# Patient Record
Sex: Male | Born: 1985 | Race: White | Hispanic: Yes | Marital: Married | Smoking: Current every day smoker
Health system: Southern US, Community
[De-identification: ages and names within clinical notes are randomized; demographics above are authoritative.]

## PROBLEM LIST (undated history)

## (undated) ENCOUNTER — Emergency Department (HOSPITAL_COMMUNITY): Admission: EM | Payer: No Typology Code available for payment source | Source: Home / Self Care

## (undated) HISTORY — PX: APPENDECTOMY: SHX54

---
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
## 2004-02-03 ENCOUNTER — Emergency Department (HOSPITAL_COMMUNITY): Admission: EM | Admit: 2004-02-03 | Discharge: 2004-02-03 | Payer: Self-pay | Admitting: Emergency Medicine

## 2006-01-17 ENCOUNTER — Emergency Department (HOSPITAL_COMMUNITY): Admission: EM | Admit: 2006-01-17 | Discharge: 2006-01-17 | Payer: Self-pay | Admitting: Family Medicine

## 2006-04-30 ENCOUNTER — Emergency Department (HOSPITAL_COMMUNITY): Admission: EM | Admit: 2006-04-30 | Discharge: 2006-04-30 | Payer: Self-pay | Admitting: Emergency Medicine

## 2007-09-14 ENCOUNTER — Emergency Department (HOSPITAL_COMMUNITY): Admission: EM | Admit: 2007-09-14 | Discharge: 2007-09-14 | Payer: Self-pay | Admitting: Emergency Medicine

## 2008-06-01 ENCOUNTER — Emergency Department (HOSPITAL_COMMUNITY): Admission: EM | Admit: 2008-06-01 | Discharge: 2008-06-01 | Payer: Self-pay | Admitting: Emergency Medicine

## 2008-06-24 ENCOUNTER — Emergency Department (HOSPITAL_COMMUNITY): Admission: EM | Admit: 2008-06-24 | Discharge: 2008-06-24 | Payer: Self-pay | Admitting: Emergency Medicine

## 2008-12-01 ENCOUNTER — Emergency Department (HOSPITAL_COMMUNITY): Admission: EM | Admit: 2008-12-01 | Discharge: 2008-12-01 | Payer: Self-pay | Admitting: Family Medicine

## 2009-02-04 ENCOUNTER — Emergency Department (HOSPITAL_COMMUNITY): Admission: EM | Admit: 2009-02-04 | Discharge: 2009-02-04 | Payer: Self-pay | Admitting: Family Medicine

## 2010-05-10 ENCOUNTER — Inpatient Hospital Stay (INDEPENDENT_AMBULATORY_CARE_PROVIDER_SITE_OTHER)
Admission: RE | Admit: 2010-05-10 | Discharge: 2010-05-10 | Disposition: A | Discharge status: Home / Self Care | Payer: Self-pay | Source: Ambulatory Visit | Attending: Family Medicine | Admitting: Family Medicine

## 2010-05-10 DIAGNOSIS — M7020 Olecranon bursitis, unspecified elbow: Secondary | ICD-10-CM

## 2010-05-23 LAB — POCT URINALYSIS DIP (DEVICE)
Bilirubin Urine: NEGATIVE
Glucose, UA: NEGATIVE mg/dL
Ketones, ur: NEGATIVE mg/dL
Nitrite: NEGATIVE
Urobilinogen, UA: 0.2 mg/dL (ref 0.0–1.0)

## 2010-05-31 LAB — POCT URINALYSIS DIP (DEVICE)
Hgb urine dipstick: NEGATIVE
Urobilinogen, UA: 0.2 mg/dL (ref 0.0–1.0)
pH: 6.5 (ref 5.0–8.0)

## 2010-09-29 ENCOUNTER — Inpatient Hospital Stay (INDEPENDENT_AMBULATORY_CARE_PROVIDER_SITE_OTHER)
Admission: RE | Admit: 2010-09-29 | Discharge: 2010-09-29 | Disposition: A | Discharge status: Home / Self Care | Payer: Self-pay | Source: Ambulatory Visit | Attending: Emergency Medicine | Admitting: Emergency Medicine

## 2010-09-29 DIAGNOSIS — S81009A Unspecified open wound, unspecified knee, initial encounter: Secondary | ICD-10-CM

## 2010-10-03 ENCOUNTER — Emergency Department (HOSPITAL_COMMUNITY): Payer: Self-pay

## 2010-10-03 ENCOUNTER — Emergency Department (HOSPITAL_COMMUNITY)
Admission: EM | Admit: 2010-10-03 | Discharge: 2010-10-03 | Disposition: A | Discharge status: Home / Self Care | Payer: Self-pay | Attending: Emergency Medicine | Admitting: Emergency Medicine

## 2010-10-03 DIAGNOSIS — M79609 Pain in unspecified limb: Secondary | ICD-10-CM | POA: Insufficient documentation

## 2010-10-03 DIAGNOSIS — R110 Nausea: Secondary | ICD-10-CM | POA: Insufficient documentation

## 2010-10-03 DIAGNOSIS — R079 Chest pain, unspecified: Secondary | ICD-10-CM | POA: Insufficient documentation

## 2011-08-15 ENCOUNTER — Emergency Department (INDEPENDENT_AMBULATORY_CARE_PROVIDER_SITE_OTHER): Payer: Self-pay

## 2011-08-15 ENCOUNTER — Emergency Department (INDEPENDENT_AMBULATORY_CARE_PROVIDER_SITE_OTHER)
Admission: EM | Admit: 2011-08-15 | Discharge: 2011-08-15 | Disposition: A | Discharge status: Home / Self Care | Payer: Self-pay | Source: Home / Self Care | Attending: Family Medicine | Admitting: Family Medicine

## 2011-08-15 ENCOUNTER — Encounter (HOSPITAL_COMMUNITY): Payer: Self-pay | Admitting: *Deleted

## 2011-08-15 DIAGNOSIS — S91331A Puncture wound without foreign body, right foot, initial encounter: Secondary | ICD-10-CM

## 2011-08-15 DIAGNOSIS — S91309A Unspecified open wound, unspecified foot, initial encounter: Secondary | ICD-10-CM

## 2011-08-15 LAB — POCT RAPID STREP A: Streptococcus, Group A Screen (Direct): NEGATIVE

## 2011-08-15 MED ORDER — TETANUS-DIPHTH-ACELL PERTUSSIS 5-2.5-18.5 LF-MCG/0.5 IM SUSP
0.5000 mL | Freq: Once | INTRAMUSCULAR | Status: AC
  Administered 2011-08-15: 0.5 mL via INTRAMUSCULAR

## 2011-08-15 MED ORDER — CIPROFLOXACIN HCL 500 MG PO TABS: 500.0000 mg | ORAL_TABLET | Freq: Two times a day (BID) | ORAL | Status: AC

## 2011-08-15 MED ORDER — TETANUS-DIPHTH-ACELL PERTUSSIS 5-2.5-18.5 LF-MCG/0.5 IM SUSP
INTRAMUSCULAR | Status: AC
  Filled 2011-08-15: qty 0.5

## 2011-08-15 NOTE — ED Notes (Signed)
Pt  Also  Reports  He  Has  A  sorethroat      And  That a  Family  Member     Has   The  Symptoms  As  Well
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
## 2011-08-15 NOTE — ED Notes (Signed)
Pt  Reports      He  inj  His   r     Foot      Yesterday         He  Stuck     A  Nail         In the  Foot       He  States  He  Pulled   It  Out  -  He  Now  Has  Pain  And  Swelling

## 2011-08-15 NOTE — Discharge Instructions (Signed)
Soak foot in warm salt water twice a day, take antibiotic, return on thurs for recheck.

## 2013-01-01 ENCOUNTER — Encounter (HOSPITAL_COMMUNITY): Payer: Self-pay | Admitting: Emergency Medicine

## 2013-01-01 ENCOUNTER — Emergency Department (HOSPITAL_COMMUNITY)
Admission: EM | Admit: 2013-01-01 | Discharge: 2013-01-01 | Disposition: A | Discharge status: Home / Self Care | Payer: Self-pay | Attending: Emergency Medicine | Admitting: Emergency Medicine

## 2013-01-01 ENCOUNTER — Emergency Department (HOSPITAL_COMMUNITY): Payer: Self-pay

## 2013-01-01 DIAGNOSIS — F17200 Nicotine dependence, unspecified, uncomplicated: Secondary | ICD-10-CM | POA: Insufficient documentation

## 2013-01-01 DIAGNOSIS — S60552A Superficial foreign body of left hand, initial encounter: Secondary | ICD-10-CM

## 2013-01-01 DIAGNOSIS — S60559A Superficial foreign body of unspecified hand, initial encounter: Secondary | ICD-10-CM | POA: Insufficient documentation

## 2013-01-01 DIAGNOSIS — X7401XA Intentional self-harm by airgun, initial encounter: Secondary | ICD-10-CM | POA: Insufficient documentation

## 2013-01-01 MED ORDER — IBUPROFEN 800 MG PO TABS: 800.0000 mg | ORAL_TABLET | Freq: Three times a day (TID) | ORAL | Status: DC

## 2013-01-01 MED ORDER — ONDANSETRON HCL 4 MG/2ML IJ SOLN
4.0000 mg | Freq: Once | INTRAMUSCULAR | Status: AC
  Administered 2013-01-01: 4 mg via INTRAVENOUS
  Filled 2013-01-01: qty 2

## 2013-01-01 MED ORDER — FENTANYL CITRATE 0.05 MG/ML IJ SOLN
50.0000 ug | Freq: Once | INTRAMUSCULAR | Status: AC
  Administered 2013-01-01: 50 ug via INTRAVENOUS
  Filled 2013-01-01: qty 2

## 2013-01-01 MED ORDER — CEPHALEXIN 500 MG PO CAPS: 500.0000 mg | ORAL_CAPSULE | Freq: Four times a day (QID) | ORAL | Status: DC

## 2013-01-01 MED ORDER — CEFAZOLIN SODIUM 1-5 GM-% IV SOLN
1.0000 g | Freq: Once | INTRAVENOUS | Status: AC
  Administered 2013-01-01: 1 g via INTRAVENOUS
  Filled 2013-01-01: qty 50

## 2013-01-01 MED ORDER — TETANUS-DIPHTH-ACELL PERTUSSIS 5-2.5-18.5 LF-MCG/0.5 IM SUSP: 0.5000 mL | Freq: Once | INTRAMUSCULAR | Status: DC
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
## 2013-01-01 NOTE — ED Notes (Signed)
MD at bedside. W/ suture cart

## 2013-01-01 NOTE — ED Notes (Signed)
Pt verbalized understanding of discharge instructions and stated family would be driving him home.
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
## 2013-01-01 NOTE — ED Notes (Signed)
EDP at bedside with ultrasound 

## 2013-04-14 ENCOUNTER — Encounter (HOSPITAL_COMMUNITY): Payer: Self-pay | Admitting: Emergency Medicine

## 2013-04-14 ENCOUNTER — Emergency Department (HOSPITAL_COMMUNITY)
Admission: EM | Admit: 2013-04-14 | Discharge: 2013-04-14 | Disposition: A | Discharge status: Home / Self Care | Payer: No Typology Code available for payment source | Source: Home / Self Care | Attending: Emergency Medicine | Admitting: Emergency Medicine

## 2013-04-14 ENCOUNTER — Other Ambulatory Visit (HOSPITAL_COMMUNITY)
Admission: RE | Admit: 2013-04-14 | Discharge: 2013-04-14 | Disposition: A | Discharge status: Home / Self Care | Payer: No Typology Code available for payment source | Source: Ambulatory Visit | Attending: Emergency Medicine | Admitting: Emergency Medicine

## 2013-04-14 DIAGNOSIS — R300 Dysuria: Secondary | ICD-10-CM

## 2013-04-14 DIAGNOSIS — Z113 Encounter for screening for infections with a predominantly sexual mode of transmission: Secondary | ICD-10-CM | POA: Insufficient documentation

## 2013-04-14 LAB — POCT URINALYSIS DIP (DEVICE)
Bilirubin Urine: NEGATIVE
GLUCOSE, UA: NEGATIVE mg/dL
Hgb urine dipstick: NEGATIVE
KETONES UR: NEGATIVE mg/dL
Leukocytes, UA: NEGATIVE
Nitrite: NEGATIVE
Protein, ur: NEGATIVE mg/dL
SPECIFIC GRAVITY, URINE: 1.02 (ref 1.005–1.030)
Urobilinogen, UA: 0.2 mg/dL (ref 0.0–1.0)
pH: 7 (ref 5.0–8.0)

## 2013-04-14 MED ORDER — CEFTRIAXONE SODIUM 250 MG IJ SOLR
INTRAMUSCULAR | Status: AC
  Filled 2013-04-14: qty 250

## 2013-04-14 MED ORDER — CEFTRIAXONE SODIUM 250 MG IJ SOLR
250.0000 mg | Freq: Once | INTRAMUSCULAR | Status: AC
  Administered 2013-04-14: 250 mg via INTRAMUSCULAR

## 2013-04-14 MED ORDER — CIPROFLOXACIN HCL 500 MG PO TABS: 500.0000 mg | ORAL_TABLET | Freq: Two times a day (BID) | ORAL | Status: DC

## 2013-04-14 MED ORDER — AZITHROMYCIN 250 MG PO TABS
1000.0000 mg | ORAL_TABLET | Freq: Once | ORAL | Status: AC
  Administered 2013-04-14: 1000 mg via ORAL

## 2013-04-14 MED ORDER — AZITHROMYCIN 250 MG PO TABS
ORAL_TABLET | ORAL | Status: AC
  Filled 2013-04-14: qty 4

## 2013-04-14 MED ORDER — LIDOCAINE HCL (PF) 1 % IJ SOLN
INTRAMUSCULAR | Status: AC
  Filled 2013-04-14: qty 5

## 2013-04-14 NOTE — ED Notes (Signed)
Patient states he has burning when he voids

## 2013-04-14 NOTE — Discharge Instructions (Signed)

## 2013-04-14 NOTE — ED Provider Notes (Signed)
Chief Complaint   Chief Complaint  Patient presents with  . painful urination     History of Present Illness   Harold Peters is a 28 year old male who has had a two-week history of dysuria, and some blood in the tip of the urethra. He denies any discharge. He's had no frequency, urgency, change in his stream, lower abdominal or lower back pain. He denies any fever, chills, nausea, or vomiting. He's not had a prior history of urinary tract infections, urinary stones, or prostatitis.  Review of Systems   Other than as noted above, the patient denies any of the following symptoms: General:  No fever or chills. GI:  No abdominal pain, back pain, nausea, vomiting, diarrhea, or constipation. GU:  No dysuria, frequency, urgency, hematuria, urethral discharge, penile lesions, penile pain, testicular pain, swelling, or mass, or inguinal lymphadenopathy.  PMFSH   Past medical history, family history, social history, meds, and allergies were reviewed.    Physical Exam    Vital signs:  BP 136/80  Pulse 80  Temp(Src) 97.8 F (36.6 C) (Oral)  Resp 14  SpO2 100% Gen:  Alert, oriented, in no distress. Lungs:  Clear to auscultation, no wheezes, rales or rhonchi. Heart:  Regular rhythm, no gallop or murmer. Abdomen:  Flat and soft.  No tenderness to palpation, guarding, or rebound.  No hepato-splenomegaly or mass.  Bowel sounds were normally active.  No hernia. Genital exam:  Was completely normal. There was no urethral discharge or bleeding. No lesions on the penis, no inguinal lymphadenopathy, no testicular tenderness, mass, or swelling. Back:  No CVA tenderness.  Skin:  Clear, warm and dry.  Labs   Results for orders placed during the hospital encounter of 04/14/13  POCT URINALYSIS DIP (DEVICE)      Result Value Ref Range   Glucose, UA NEGATIVE  NEGATIVE mg/dL   Bilirubin Urine NEGATIVE  NEGATIVE   Ketones, ur NEGATIVE  NEGATIVE mg/dL   Specific Gravity, Urine 1.020   1.005 - 1.030   Hgb urine dipstick NEGATIVE  NEGATIVE   pH 7.0  5.0 - 8.0   Protein, ur NEGATIVE  NEGATIVE mg/dL   Urobilinogen, UA 0.2  0.0 - 1.0 mg/dL   Nitrite NEGATIVE  NEGATIVE   Leukocytes, UA NEGATIVE  NEGATIVE    The urine was cultured. DNA probes were obtained for gonorrhea, Chlamydia, Trichomonas.  Course in Urgent Care Center   He was given Rocephin 250 mg IM and azithromycin 1000 mg by mouth.  Assessment   The encounter diagnosis was Dysuria.   Differential diagnosis includes urinary tract infection, prostatitis, urethritis, diarrhea, Chlamydia, or Trichomonas.  Plan   1.  Meds:  The following meds were prescribed:   Discharge Medication List as of 04/14/2013  7:35 PM    START taking these medications   Details  ciprofloxacin (CIPRO) 500 MG tablet Take 1 tablet (500 mg total) by mouth every 12 (twelve) hours., Starting 04/14/2013, Until Discontinued, Normal        2.  Patient Education/Counseling:  The patient was given appropriate handouts, self care instructions, and instructed in symptomatic relief.    3.  Follow up:  The patient was told to follow up here if no better in 3 to 4 days, or sooner if becoming worse in any way, and given some red flag symptoms such as fever, persistent vomiting, pain, or difficulty urinating which would prompt immediate return.  I would like him to followup with a urologist in 2 weeks.  Reuben Likesavid C Ensley Blas, MD 04/14/13 [phone]

## 2013-04-15 LAB — URINE CYTOLOGY ANCILLARY ONLY
CHLAMYDIA, DNA PROBE: NEGATIVE
NEISSERIA GONORRHEA: NEGATIVE
Trichomonas: NEGATIVE

## 2013-04-16 LAB — URINE CULTURE

## 2013-04-16 NOTE — ED Notes (Signed)
GC/Chlamydia/Trich neg., urine culture: 35,000 colonies Group B Strep (S. Agalactiae)-no sensitivity due to the predictability of PCN.  Pt. treated with Cipro.  Discussed with Langston MaskerKaren Sofia PA and she said this is contaminant and treatment should be adequate. Vassie MoselleYork, Serrina Minogue M 04/16/2013

## 2013-04-17 ENCOUNTER — Telehealth (HOSPITAL_COMMUNITY): Payer: Self-pay | Admitting: *Deleted

## 2013-04-17 NOTE — ED Notes (Addendum)
Dr. Lorenz CoasterKeller requested I called pt. for clinical improvement. If not better he said to call pt. Amoxicillin. I called pt. and left a message to call. Call 1. Harold Peters, Avice Funchess M 04/17/2013 Pt. called back.  Pt. verified x 2 and given results.  Pt. said it still burns when he peas.  Pt. told to stop Cipro and take Amoxicillin for 10 days.  Pt. wants Rx. called to [facility] on [address].   I called Rx. to pharmacist @ [phone].

## 2013-06-20 ENCOUNTER — Emergency Department (HOSPITAL_COMMUNITY)
Admission: EM | Admit: 2013-06-20 | Discharge: 2013-06-20 | Disposition: A | Discharge status: Home / Self Care | Payer: No Typology Code available for payment source | Attending: Emergency Medicine | Admitting: Emergency Medicine

## 2013-06-20 ENCOUNTER — Encounter (HOSPITAL_COMMUNITY): Payer: Self-pay | Admitting: Emergency Medicine

## 2013-06-20 ENCOUNTER — Emergency Department (HOSPITAL_COMMUNITY): Payer: No Typology Code available for payment source

## 2013-06-20 DIAGNOSIS — F17200 Nicotine dependence, unspecified, uncomplicated: Secondary | ICD-10-CM | POA: Insufficient documentation

## 2013-06-20 DIAGNOSIS — Z792 Long term (current) use of antibiotics: Secondary | ICD-10-CM | POA: Insufficient documentation

## 2013-06-20 DIAGNOSIS — Y9389 Activity, other specified: Secondary | ICD-10-CM | POA: Insufficient documentation

## 2013-06-20 DIAGNOSIS — Y92410 Unspecified street and highway as the place of occurrence of the external cause: Secondary | ICD-10-CM | POA: Insufficient documentation

## 2013-06-20 DIAGNOSIS — R0789 Other chest pain: Secondary | ICD-10-CM

## 2013-06-20 DIAGNOSIS — S0993XA Unspecified injury of face, initial encounter: Secondary | ICD-10-CM | POA: Insufficient documentation

## 2013-06-20 DIAGNOSIS — S199XXA Unspecified injury of neck, initial encounter: Secondary | ICD-10-CM

## 2013-06-20 DIAGNOSIS — S298XXA Other specified injuries of thorax, initial encounter: Secondary | ICD-10-CM | POA: Insufficient documentation

## 2013-06-20 MED ORDER — IBUPROFEN 800 MG PO TABS: 800.0000 mg | ORAL_TABLET | Freq: Three times a day (TID) | ORAL | Status: DC | PRN

## 2013-06-20 MED ORDER — HYDROCODONE-ACETAMINOPHEN 5-325 MG PO TABS: 1.0000 | ORAL_TABLET | Freq: Four times a day (QID) | ORAL | Status: DC | PRN

## 2013-06-20 MED ORDER — OXYCODONE-ACETAMINOPHEN 5-325 MG PO TABS
1.0000 | ORAL_TABLET | Freq: Once | ORAL | Status: AC
  Administered 2013-06-20: 1 via ORAL
  Filled 2013-06-20: qty 1

## 2013-06-20 NOTE — Discharge Instructions (Signed)
Return here as needed.  Followup with your primary care doctor use ice and heat on areas that are sore.  The x-rays didn't show any abnormality
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
## 2013-06-20 NOTE — Discharge Planning (Signed)
[facility] Community Liaison  Patient is an [facility]. Patient states he hasn't had an appointment with the clinic since obtaining the orange card. Follow up appointment was made for Monday May 4,2015 at 12:00 pm, patient is aware of this appointment. Patient also expressed concerns about wanted "help with drinking", resources were given by Tyler Aasoris B. Child psychotherapistocial Worker. Patient was educated on the importance of maintaining a relationship with his primary care and proper uses of his orange card. No other needs expressed at this time.

## 2013-06-23 ENCOUNTER — Ambulatory Visit: Payer: No Typology Code available for payment source

## 2013-06-23 NOTE — ED Provider Notes (Signed)
Medical screening examination/treatment/procedure(s) were performed by non-physician practitioner and as supervising physician I was immediately available for consultation/collaboration.   EKG Interpretation None        Dequavious Harshberger W. Tabetha Haraway, MD 06/23/13 1652 

## 2013-06-27 ENCOUNTER — Ambulatory Visit: Payer: No Typology Code available for payment source | Attending: Internal Medicine | Admitting: Internal Medicine

## 2013-06-27 ENCOUNTER — Encounter: Payer: Self-pay | Admitting: Internal Medicine

## 2013-06-27 VITALS — BP 123/67 | HR 55 | Temp 98.8°F | Resp 16 | Wt 214.0 lb

## 2013-06-27 DIAGNOSIS — R071 Chest pain on breathing: Secondary | ICD-10-CM

## 2013-06-27 DIAGNOSIS — M25519 Pain in unspecified shoulder: Secondary | ICD-10-CM | POA: Insufficient documentation

## 2013-06-27 DIAGNOSIS — Z7689 Persons encountering health services in other specified circumstances: Secondary | ICD-10-CM

## 2013-06-27 DIAGNOSIS — Y92410 Unspecified street and highway as the place of occurrence of the external cause: Secondary | ICD-10-CM | POA: Insufficient documentation

## 2013-06-27 DIAGNOSIS — F1010 Alcohol abuse, uncomplicated: Secondary | ICD-10-CM | POA: Insufficient documentation

## 2013-06-27 DIAGNOSIS — Z79899 Other long term (current) drug therapy: Secondary | ICD-10-CM | POA: Insufficient documentation

## 2013-06-27 DIAGNOSIS — R0789 Other chest pain: Secondary | ICD-10-CM

## 2013-06-27 DIAGNOSIS — F17200 Nicotine dependence, unspecified, uncomplicated: Secondary | ICD-10-CM

## 2013-06-27 DIAGNOSIS — K029 Dental caries, unspecified: Secondary | ICD-10-CM

## 2013-06-27 DIAGNOSIS — IMO0001 Reserved for inherently not codable concepts without codable children: Secondary | ICD-10-CM

## 2013-06-27 DIAGNOSIS — M791 Myalgia, unspecified site: Secondary | ICD-10-CM

## 2013-06-27 DIAGNOSIS — Z7189 Other specified counseling: Secondary | ICD-10-CM

## 2013-06-27 MED ORDER — CYCLOBENZAPRINE HCL 10 MG PO TABS: 10.0000 mg | ORAL_TABLET | Freq: Three times a day (TID) | ORAL | Status: DC | PRN

## 2013-06-27 MED ORDER — IBUPROFEN 800 MG PO TABS: 800.0000 mg | ORAL_TABLET | Freq: Three times a day (TID) | ORAL | Status: DC | PRN
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
## 2013-07-03 ENCOUNTER — Ambulatory Visit: Payer: No Typology Code available for payment source | Attending: Internal Medicine

## 2013-07-03 DIAGNOSIS — Z7689 Persons encountering health services in other specified circumstances: Secondary | ICD-10-CM

## 2013-07-03 LAB — COMPLETE METABOLIC PANEL WITH GFR
ALT: 19 U/L (ref 0–53)
AST: 20 U/L (ref 0–37)
Albumin: 4.8 g/dL (ref 3.5–5.2)
Alkaline Phosphatase: 64 U/L (ref 39–117)
BUN: 13 mg/dL (ref 6–23)
CHLORIDE: 106 meq/L (ref 96–112)
CO2: 28 meq/L (ref 19–32)
CREATININE: 0.78 mg/dL (ref 0.50–1.35)
Calcium: 9.7 mg/dL (ref 8.4–10.5)
GFR, Est Non African American: >89 mL/min
Glucose, Bld: 78 mg/dL (ref 70–99)
Potassium: 4.9 mEq/L (ref 3.5–5.3)
Sodium: 139 mEq/L (ref 135–145)
Total Bilirubin: 0.8 mg/dL (ref 0.2–1.2)
Total Protein: 7.2 g/dL (ref 6.0–8.3)

## 2013-07-03 LAB — LIPID PANEL
CHOL/HDL RATIO: 5 ratio
Cholesterol: 149 mg/dL (ref 0–200)
HDL: 30 mg/dL — ABNORMAL LOW (ref >39)
LDL CALC: 75 mg/dL (ref 0–99)
Triglycerides: 219 mg/dL — ABNORMAL HIGH (ref <150)
VLDL: 44 mg/dL — ABNORMAL HIGH (ref 0–40)

## 2013-07-03 LAB — CBC
HEMATOCRIT: 43.9 % (ref 39.0–52.0)
Hemoglobin: 15.5 g/dL (ref 13.0–17.0)
MCH: 29.6 pg (ref 26.0–34.0)
MCHC: 35.3 g/dL (ref 30.0–36.0)
MCV: 83.9 fL (ref 78.0–100.0)
Platelets: 198 10*3/uL (ref 150–400)
RBC: 5.23 MIL/uL (ref 4.22–5.81)
RDW: 13.7 % (ref 11.5–15.5)
WBC: 5.5 10*3/uL (ref 4.0–10.5)

## 2013-07-03 LAB — HEMOGLOBIN A1C
HEMOGLOBIN A1C: 5.3 % (ref <5.7)
MEAN PLASMA GLUCOSE: 105 mg/dL (ref <117)

## 2013-07-03 LAB — TSH: TSH: 4.886 u[IU]/mL — AB (ref 0.350–4.500)

## 2013-07-04 ENCOUNTER — Telehealth: Payer: Self-pay

## 2013-07-04 DIAGNOSIS — Z1329 Encounter for screening for other suspected endocrine disorder: Secondary | ICD-10-CM

## 2013-07-04 NOTE — Telephone Encounter (Signed)
Patient called and notified of lab results.  Patient scheduled to have free T4 checked on 07/07/13. Patient educated on triglyceride level and educated on exercise, diet, and avoiding excess alcohol.  Patient verbalized understanding.

## 2013-07-07 ENCOUNTER — Ambulatory Visit: Payer: No Typology Code available for payment source | Attending: Internal Medicine

## 2013-07-07 DIAGNOSIS — Z1329 Encounter for screening for other suspected endocrine disorder: Secondary | ICD-10-CM

## 2013-07-08 LAB — T4, FREE: FREE T4: 1.16 ng/dL (ref 0.80–1.80)

## 2013-07-09 ENCOUNTER — Telehealth: Payer: Self-pay

## 2013-07-09 NOTE — Telephone Encounter (Signed)
Message copied by Marykay LexBECK, Calise Dunckel K on Wed Jul 09, 2013  4:32 PM ------      Message from: Holland CommonsKECK, VALERIE A      Created: Tue Jul 08, 2013  7:41 PM       Let patient know that his last lab was normal although his TSH was elevated so we will not treat patient at this time. If patient begin to have symptoms of weight gain, fatigue, hair loss, cold intolerance, depression, etc let him know to come back to be evaluated. Will recheck thyroid function in 6 months if no problems.  Thanks ------

## 2013-07-09 NOTE — Telephone Encounter (Signed)
Patient notified of free T4 results, and informed to notify the clinic if he has weight gain, fatigue, hair loss, cold intolerance, depression.

## 2014-03-09 ENCOUNTER — Ambulatory Visit: Payer: Self-pay | Attending: Internal Medicine

## 2014-03-16 ENCOUNTER — Ambulatory Visit: Payer: Self-pay | Admitting: Internal Medicine

## 2014-03-23 ENCOUNTER — Ambulatory Visit: Payer: Self-pay | Admitting: Internal Medicine
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
## 2014-12-30 ENCOUNTER — Emergency Department (HOSPITAL_COMMUNITY)
Admission: EM | Admit: 2014-12-30 | Discharge: 2014-12-30 | Disposition: A | Discharge status: Home / Self Care | Payer: Self-pay | Attending: Emergency Medicine | Admitting: Emergency Medicine

## 2014-12-30 ENCOUNTER — Emergency Department (HOSPITAL_COMMUNITY): Payer: Self-pay

## 2014-12-30 ENCOUNTER — Encounter (HOSPITAL_COMMUNITY): Payer: Self-pay | Admitting: Family Medicine

## 2014-12-30 DIAGNOSIS — W3189XA Contact with other specified machinery, initial encounter: Secondary | ICD-10-CM | POA: Insufficient documentation

## 2014-12-30 DIAGNOSIS — Y939 Activity, unspecified: Secondary | ICD-10-CM | POA: Insufficient documentation

## 2014-12-30 DIAGNOSIS — Y999 Unspecified external cause status: Secondary | ICD-10-CM | POA: Insufficient documentation

## 2014-12-30 DIAGNOSIS — S61235A Puncture wound without foreign body of left ring finger without damage to nail, initial encounter: Secondary | ICD-10-CM | POA: Insufficient documentation

## 2014-12-30 DIAGNOSIS — Y929 Unspecified place or not applicable: Secondary | ICD-10-CM | POA: Insufficient documentation

## 2014-12-30 MED ORDER — LIDOCAINE HCL 2 % IJ SOLN: 10.0000 mL | Freq: Once | INTRAMUSCULAR | Status: DC

## 2014-12-30 MED ORDER — TETANUS-DIPHTH-ACELL PERTUSSIS 5-2.5-18.5 LF-MCG/0.5 IM SUSP
0.5000 mL | Freq: Once | INTRAMUSCULAR | Status: AC
  Administered 2014-12-30: 0.5 mL via INTRAMUSCULAR
  Filled 2014-12-30: qty 0.5

## 2014-12-30 MED ORDER — HYDROCODONE-ACETAMINOPHEN 5-325 MG PO TABS: 1.0000 | ORAL_TABLET | Freq: Four times a day (QID) | ORAL | Status: DC | PRN

## 2014-12-30 MED ORDER — LIDOCAINE HCL 2 % IJ SOLN
10.0000 mL | Freq: Once | INTRAMUSCULAR | Status: AC
  Administered 2014-12-30: 10 mg via INTRADERMAL
  Filled 2014-12-30: qty 20

## 2014-12-30 MED ORDER — HYDROCODONE-ACETAMINOPHEN 5-325 MG PO TABS
1.0000 | ORAL_TABLET | Freq: Once | ORAL | Status: AC
Start: 2014-12-30 — End: 2014-12-30
  Administered 2014-12-30: 1 via ORAL
  Filled 2014-12-30: qty 1

## 2014-12-30 MED ORDER — CEPHALEXIN 500 MG PO CAPS: 1000.0000 mg | ORAL_CAPSULE | Freq: Two times a day (BID) | ORAL | Status: DC

## 2014-12-30 NOTE — ED Notes (Signed)
Pt here for injury to left ring finger. sts that he drilled through finger. Pt able to move finger and feeling in tact.

## 2014-12-30 NOTE — Discharge Instructions (Signed)
Puncture Wound A puncture wound is an injury that is caused by a sharp, thin object that goes through (penetrates) your skin. Usually, a puncture wound does not leave a large opening in your skin, so it may not bleed a lot. However, when you get a puncture wound, dirt or other materials (foreign bodies) can be forced into your wound and break off inside. This increases the chance of infection, such as tetanus. CAUSES Puncture wounds are caused by any sharp, thin object that goes through your skin, such as:  Animal teeth, as with an animal bite.  Sharp, pointed objects, such as nails, splinters of glass, fishhooks, and needles. SYMPTOMS Symptoms of a puncture wound include:  Pain.  Bleeding.  Swelling.  Bruising.  Fluid leaking from the wound.  Numbness, tingling, or loss of function. DIAGNOSIS This condition is diagnosed with a medical history and physical exam. Your wound will be checked to see if it contains any foreign bodies. You may also have X-rays or other imaging tests. TREATMENT Treatment for a puncture wound depends on how serious the wound is. It also depends on whether the wound contains any foreign bodies. Treatment for all types of puncture wounds usually starts with:  Controlling the bleeding.  Washing out the wound with a germ-free (sterile) salt-water solution.  Checking the wound for foreign bodies. Treatment may also include:  Having the wound opened surgically to remove a foreign object.  Closing the wound with stitches (sutures) if it continues to bleed.  Covering the wound with antibiotic ointments and a bandage (dressing).  Receiving a tetanus shot.  Receiving a rabies vaccine. HOME CARE INSTRUCTIONS Medicines  Take or apply over-the-counter and prescription medicines only as told by your health care provider.  If you were prescribed an antibiotic, take or apply it as told by your health care provider. Do not stop using the antibiotic even if  your condition improves. Wound Care  There are many ways to close and cover a wound. For example, a wound can be covered with sutures, skin glue, or adhesive strips. Follow instructions from your health care provider about:  How to take care of your wound.  When and how you should change your dressing.  When you should remove your dressing.  Removing whatever was used to close your wound.  Keep the dressing dry as told by your health care provider. Do not take baths, swim, use a hot tub, or do anything that would put your wound underwater until your health care provider approves.  Clean the wound as told by your health care provider.  Do not scratch or pick at the wound.  Check your wound every day for signs of infection. Watch for:  Redness, swelling, or pain.  Fluid, blood, or pus. General Instructions  Raise (elevate) the injured area above the level of your heart while you are sitting or lying down.  If your puncture wound is in your foot, ask your health care provider if you need to avoid putting weight on your foot and for how long.  Keep all follow-up visits as told by your health care provider. This is important. SEEK MEDICAL CARE IF:  You received a tetanus shot and you have swelling, severe pain, redness, or bleeding at the injection site.  You have a fever.  Your sutures come out.  You notice a bad smell coming from your wound or your dressing.  You notice something coming out of your wound, such as wood or glass.  Your   pain is not controlled with medicine.  You have increased redness, swelling, or pain at the site of your wound.  You have fluid, blood, or pus coming from your wound.  You notice a change in the color of your skin near your wound.  You need to change the dressing frequently due to fluid, blood, or pus draining from your wound.  You develop a new rash.  You develop numbness around your wound. SEEK IMMEDIATE MEDICAL CARE IF:  You  develop severe swelling around your wound.  Your pain suddenly increases and is severe.  You develop painful skin lumps.  You have a red streak going away from your wound.  The wound is on your hand or foot and you cannot properly move a finger or toe.  The wound is on your hand or foot and you notice that your fingers or toes look pale or bluish.   This information is not intended to replace advice given to you by your health care provider. Make sure you discuss any questions you have with your health care provider.   Document Released: 11/16/2004 Document Revised: 10/28/2014 Document Reviewed: 04/01/2014 Elsevier Interactive Patient Education 2016 [employer]  

## 2014-12-30 NOTE — ED Provider Notes (Signed)
CSN: [id number]     Arrival date & time 12/30/14  1210 History  By signing my name below, I, Lyndel Safe, attest that this documentation has been prepared under the direction and in the presence of Fayrene Helper, PA-C.  Electronically Signed: Lyndel Safe, ED Scribe. 12/30/2014. 1:35 PM.   Chief Complaint  Patient presents with  . Finger Injury   Patient is a 29 y.o. male presenting with hand pain. The history is provided by the patient. No language interpreter was used.  Hand Pain This is a new problem. The current episode started 1 to 2 hours ago. The problem occurs constantly. The problem has not changed since onset.Nothing aggravates the symptoms. Nothing relieves the symptoms. He has tried nothing for the symptoms. The treatment provided no relief.   HPI Comments: Harold Peters is a 29 y.o. male, who is right hand dominant, presents to the Emergency Department for evaluation of a left, 4th finger injury that occurred 2 hours ago. He has a through and through puncture wound to the distal aspect of the left 4th ring finger. Bleeding is controlled.Pt reports he was working with a drill when the drill slipped and punctured through his left, 4th digit.  He associates intermittent, sharp pain with the puncture wound. Tetanus not UTD. No fevers.   History reviewed. No pertinent past medical history. Past Surgical History  Procedure Laterality Date  . Appendectomy     Family History  Problem Relation Age of Onset  . Diabetes Mother   . Diabetes Maternal Uncle    Social History  Substance Use Topics  . Smoking status: Current Every Day Smoker  . Smokeless tobacco: None  . Alcohol Use: 1.8 oz/week    3 Cans of beer per week    Review of Systems  Constitutional: Negative for fever.  Skin: Positive for wound ( puncture wound to left 4th finger ).  Hematological: Does not bruise/bleed easily.   Allergies  Review of patient's allergies indicates no known  allergies.  Home Medications   Prior to Admission medications   Medication Sig Start Date End Date Taking? Authorizing Provider  acetaminophen (TYLENOL) 500 MG tablet Take 1,000 mg by mouth every 6 (six) hours as needed for moderate pain, fever or headache.    Historical Provider, MD  cyclobenzaprine (FLEXERIL) 10 MG tablet Take 1 tablet (10 mg total) by mouth 3 (three) times daily as needed for muscle spasms. 06/27/13   Ambrose Finland, NP  HYDROcodone-acetaminophen (NORCO/VICODIN) 5-325 MG per tablet Take 1 tablet by mouth every 6 (six) hours as needed for moderate pain. 06/20/13   Charlestine Night, PA-C  ibuprofen (ADVIL,MOTRIN) 800 MG tablet Take 1 tablet (800 mg total) by mouth every 8 (eight) hours as needed. 06/27/13   Ambrose Finland, NP   There were no vitals taken for this visit. Physical Exam  Constitutional: He is oriented to person, place, and time. He appears well-developed and well-nourished. No distress.  HENT:  Head: Normocephalic.  Eyes: Conjunctivae are normal.  Neck: Normal range of motion. Neck supple.  Cardiovascular: Normal rate.   Pulmonary/Chest: Effort normal. No respiratory distress.  Musculoskeletal: Normal range of motion.  Left hand, ring finger; puncture wound noted from the pad of finger penetrating through and through to the ulnar aspect of finger, no FB noted, no ulnar nerve involvement, sensation intact distally, not actively bleeding.   Neurological: He is alert and oriented to person, place, and time. Coordination normal.  Skin: Skin is warm.  Psychiatric:  He has a normal mood and affect. His behavior is normal.  Nursing note and vitals reviewed.   ED Course  Procedures   COORDINATION OF CARE: 1:24 PM Discussed treatment plan with pt at bedside and pt agreed to plan. Tdap updated. Pain medication ordered.   LACERATION REPAIR PROCEDURE NOTE The patient's identification was confirmed and consent was obtained. This procedure was performed by Fayrene HelperBowie Makinsey Pepitone,  PA-C at 1:49 PM. Site: left ring finger Sterile procedures observed Anesthetic used (type and amt): digital block performed with 3ml of lidocaine 2% w/o epinephrine  Suture type/size: no sutures used  Antibx ointment applied Tetanus ordered Site anesthetized, irrigated with NS, explored without evidence of foreign body, site covered with dry, sterile dressing.  Patient tolerated procedure well without complications. Instructions for care discussed verbally and patient provided with additional written instructions for homecare and f/u.   Imaging Review Dg Finger Ring Left  12/30/2014  CLINICAL DATA:  Recent injury to the fourth digit with a drill, initial encounter EXAM: LEFT RING FINGER 2+V COMPARISON:  01/01/2013 FINDINGS: Soft tissue defect is noted along the distal phalanx of the fourth digit. No underlying bony abnormality is noted. No radiopaque foreign body is seen. IMPRESSION: Soft tissue injury without acute bony abnormality. Electronically Signed   By: Alcide CleverMark  Lukens M.D.   On: 12/30/2014 13:14   I have personally reviewed and evaluated these images as part of my medical decision-making.   MDM   Final diagnoses:  Puncture wound of left ring finger w/o foreign body w/o damage to nail, initial encounter    BP 116/70 mmHg  Pulse 55  Resp 16  SpO2 100%  I personally performed the services described in this documentation, which was scribed in my presence. The recorded information has been reviewed and is accurate.        Fayrene HelperBowie Nobie Alleyne, PA-C 12/30/14 1424  Mancel BaleElliott Wentz, MD 12/31/14 1911

## 2014-12-30 NOTE — ED Notes (Signed)
Pt was drilling fiber-glass, slipped and drilled into left ring finger. Minimally oozing blood.

## 2016-04-06 ENCOUNTER — Encounter (HOSPITAL_COMMUNITY): Payer: Self-pay | Admitting: Emergency Medicine

## 2016-04-06 ENCOUNTER — Emergency Department (HOSPITAL_COMMUNITY): Payer: Self-pay

## 2016-04-06 ENCOUNTER — Emergency Department (HOSPITAL_COMMUNITY)
Admission: EM | Admit: 2016-04-06 | Discharge: 2016-04-06 | Disposition: A | Discharge status: Home / Self Care | Payer: Self-pay | Attending: Emergency Medicine | Admitting: Emergency Medicine

## 2016-04-06 DIAGNOSIS — F17210 Nicotine dependence, cigarettes, uncomplicated: Secondary | ICD-10-CM | POA: Insufficient documentation

## 2016-04-06 DIAGNOSIS — J111 Influenza due to unidentified influenza virus with other respiratory manifestations: Secondary | ICD-10-CM | POA: Insufficient documentation

## 2016-04-06 DIAGNOSIS — Z79899 Other long term (current) drug therapy: Secondary | ICD-10-CM | POA: Insufficient documentation

## 2016-04-06 LAB — INFLUENZA PANEL BY PCR (TYPE A & B)
Influenza A By PCR: NEGATIVE
Influenza B By PCR: POSITIVE — AB

## 2016-04-06 LAB — RAPID STREP SCREEN (MED CTR MEBANE ONLY): Streptococcus, Group A Screen (Direct): NEGATIVE

## 2016-04-06 MED ORDER — ACETAMINOPHEN 325 MG PO TABS
ORAL_TABLET | ORAL | Status: AC
  Filled 2016-04-06: qty 2

## 2016-04-06 MED ORDER — ACETAMINOPHEN 325 MG PO TABS
650.0000 mg | ORAL_TABLET | Freq: Once | ORAL | Status: AC | PRN
  Administered 2016-04-06: 650 mg via ORAL

## 2016-04-06 MED ORDER — SODIUM CHLORIDE 0.9 % IV BOLUS (SEPSIS)
1000.0000 mL | Freq: Once | INTRAVENOUS | Status: AC
  Administered 2016-04-06: 1000 mL via INTRAVENOUS

## 2016-04-06 MED ORDER — GUAIFENESIN-CODEINE 100-10 MG/5ML PO SOLN: 5.0000 mL | Freq: Three times a day (TID) | ORAL | 0 refills | Status: DC | PRN

## 2016-04-06 MED ORDER — IBUPROFEN 400 MG PO TABS
600.0000 mg | ORAL_TABLET | Freq: Once | ORAL | Status: AC
  Administered 2016-04-06: 600 mg via ORAL
  Filled 2016-04-06: qty 1

## 2016-04-06 NOTE — ED Triage Notes (Signed)
Pt from home with c/o fever, cough, sore throat, body aches, chills, headache, and dizziness x 2 days.  Pt having difficulty standing due to dizziness in triage.  A&O.

## 2016-04-06 NOTE — ED Provider Notes (Signed)
[facility] Provider Note   CSN: [id number] Arrival date & time: 04/06/16  4098     History   Chief Complaint Chief Complaint  Patient presents with  . Flu Like Symptoms    HPI Doctor Sheahan is a 31 y.o. male.  31 year old Hispanic male with no significant past medical history presents to the ED today with flulike symptoms. Specifically patient complains of fever, cough, sore throat, body aches, chills, headache, dizziness 2 days. Patient states that his cousin was diagnosed with the flu last week. Patient has been taking Tylenol at home for his fever with little relief. He has had poor by mouth intake for the past 2 days. Patient states that when he stands he gets dizzy and lightheaded. Patient states thatit hurts to swallow on both sides of his throat. he endorses a nonproductive cough. He also endorses clear rhinorrhea and nasal congestion. Denies getting a flu vaccination this year. Nothing makes his symptoms better or worse. He denies any vision changes, chest pain, shortness of breath, abdominal pain, nausea, emesis, urinary symptoms, change in bowel habits, paresthesias.      History reviewed. No pertinent past medical history.  There are no active problems to display for this patient.   Past Surgical History:  Procedure Laterality Date  . APPENDECTOMY         Home Medications    Prior to Admission medications   Medication Sig Start Date End Date Taking? Authorizing Provider  acetaminophen (TYLENOL) 500 MG tablet Take 1,000 mg by mouth every 6 (six) hours as needed for moderate pain, fever or headache.    Historical Provider, MD  cephALEXin (KEFLEX) 500 MG capsule Take 2 capsules (1,000 mg total) by mouth 2 (two) times daily. 12/30/14   Fayrene Helper, PA-C  cyclobenzaprine (FLEXERIL) 10 MG tablet Take 1 tablet (10 mg total) by mouth 3 (three) times daily as needed for muscle spasms. 06/27/13   Ambrose Finland, NP  HYDROcodone-acetaminophen  (NORCO/VICODIN) 5-325 MG tablet Take 1 tablet by mouth every 6 (six) hours as needed for moderate pain. 12/30/14   Fayrene Helper, PA-C  ibuprofen (ADVIL,MOTRIN) 800 MG tablet Take 1 tablet (800 mg total) by mouth every 8 (eight) hours as needed. 06/27/13   Ambrose Finland, NP    Family History Family History  Problem Relation Age of Onset  . Diabetes Mother   . Diabetes Maternal Uncle     Social History Social History  Substance Use Topics  . Smoking status: Current Every Day Smoker    Packs/day: 0.20    Types: Cigarettes, E-cigarettes  . Smokeless tobacco: Never Used  . Alcohol use 1.8 oz/week    3 Cans of beer per week     Allergies   Patient has no known allergies.   Review of Systems Review of Systems  Constitutional: Positive for activity change and appetite change. Negative for chills and fever.  HENT: Positive for congestion, rhinorrhea, sneezing and sore throat.   Eyes: Negative for visual disturbance.  Respiratory: Positive for cough. Negative for shortness of breath and wheezing.   Cardiovascular: Negative for chest pain, palpitations and leg swelling.  Gastrointestinal: Negative for abdominal pain, diarrhea, nausea and vomiting.  Genitourinary: Negative for dysuria, frequency, hematuria and urgency.  Musculoskeletal: Positive for myalgias.  Skin: Negative.   Neurological: Positive for dizziness and headaches. Negative for syncope, weakness, light-headedness and numbness.  All other systems reviewed and are negative.    Physical Exam Updated Vital Signs BP 135/100 (BP Location: Right  Arm)   Pulse 99   Temp (!) 103.1 F (39.5 C) (Oral)   Resp 21   Ht 5\' 11"  (1.803 m)   Wt 101.6 kg   SpO2 100%   BMI 31.24 kg/m   Physical Exam  Constitutional: He is oriented to person, place, and time. He appears well-developed and well-nourished. No distress.  HENT:  Head: Normocephalic and atraumatic.  Right Ear: Tympanic membrane, external ear and ear canal normal.    Left Ear: Tympanic membrane, external ear and ear canal normal.  Nose: Mucosal edema and rhinorrhea present.  Mouth/Throat: Uvula is midline. Mucous membranes are dry. Posterior oropharyngeal edema and posterior oropharyngeal erythema present. No oropharyngeal exudate or tonsillar abscesses. Tonsils are 2+ on the right. Tonsils are 2+ on the left. No tonsillar exudate.  No trismus. No muffled voice. Uvula is midline.  Eyes: Conjunctivae are normal. Right eye exhibits no discharge. Left eye exhibits no discharge. No scleral icterus.  Neck: Normal range of motion. Neck supple. No thyromegaly present.  Cardiovascular: Regular rhythm, normal heart sounds and intact distal pulses.  Tachycardia present.  Exam reveals no gallop and no friction rub.   No murmur heard. Pulmonary/Chest: Effort normal and breath sounds normal. No respiratory distress. He has no wheezes. He exhibits no tenderness.  CTAB. Patient not hypoxic or tachypnea.  Abdominal: Soft. Bowel sounds are normal. He exhibits no distension. There is no tenderness. There is no rebound and no guarding.  Musculoskeletal: Normal range of motion.  Lymphadenopathy:    He has no cervical adenopathy.  Neurological: He is alert and oriented to person, place, and time.  Skin: Skin is warm and dry. Capillary refill takes less than 2 seconds.  Nursing note and vitals reviewed.    ED Treatments / Results  Labs (all labs ordered are listed, but only abnormal results are displayed) Labs Reviewed  INFLUENZA PANEL BY PCR (TYPE A & B) - Abnormal; Notable for the following:       Result Value   Influenza B By PCR POSITIVE (*)    All other components within normal limits  RAPID STREP SCREEN (NOT AT [facility])  CULTURE, GROUP A STREP [facility]    EKG  EKG Interpretation None       Radiology Dg Chest 2 View  Result Date: 04/06/2016 CLINICAL DATA:  Fever, nausea and productive cough. EXAM: CHEST  2 VIEW COMPARISON:  06/20/2013 FINDINGS: The heart  size and mediastinal contours are within normal limits. Both lungs are clear. The visualized skeletal structures are unremarkable. IMPRESSION: No active cardiopulmonary disease. Electronically Signed   By: Richarda OverlieAdam  Henn M.D.   On: 04/06/2016 10:56    Procedures Procedures (including critical care time)  Medications Ordered in ED Medications  acetaminophen (TYLENOL) tablet 650 mg (650 mg Oral Given 04/06/16 1019)  sodium chloride 0.9 % bolus 1,000 mL (0 mLs Intravenous Stopped 04/06/16 1327)  ibuprofen (ADVIL,MOTRIN) tablet 600 mg (600 mg Oral Given 04/06/16 1103)     Initial Impression / Assessment and Plan / ED Course  I have reviewed the triage vital signs and the nursing notes.  Pertinent labs & imaging results that were available during my care of the patient were reviewed by me and considered in my medical decision making (see chart for details).     The patient presented to the ED with several flulike symptoms. Patient with symptoms consistent with influenza. Positive sick contacts with the flu. Vitals are stable, low-grade fever.  Patient with mild dehydration. Given IV fluids in  the ED with significant improvement in symptoms. Fever and tachycardia improved with Motrin and Tylenol and fluids. No signs of dehydration, tolerating PO's.  Strep test is negative. Uvula is midline without any trismus. No signs of peritonsillar abscess or deep neck infection. Lungs are clear. Without any focal infiltrate.  Discussed the cost versus benefit of Tamiflu treatment with the patient.  The patient understands that symptoms are greater than the recommended 24-48 hour window of treatment.  Patient will be discharged with instructions to orally hydrate, rest, and use over-the-counter medications such as anti-inflammatories ibuprofen and Aleve for muscle aches and Tylenol for fever.  Patient will also be given a cough suppressant. Pt is hemodynamically stable, in NAD, & able to ambulate in the ED. Pain has been  managed & has no complaints prior to dc. Pt is comfortable with above plan and is stable for discharge at this time. All questions were answered prior to disposition. Strict return precautions for f/u to the ED were discussed.    Final Clinical Impressions(s) / ED Diagnoses   Final diagnoses:  Influenza    New Prescriptions Discharge Medication List as of 04/06/2016  1:24 PM    START taking these medications   Details  guaiFENesin-codeine 100-10 MG/5ML syrup Take 5 mLs by mouth 3 (three) times daily as needed for cough., Starting Thu 04/06/2016, Print         Rise Mu, PA-C 04/06/16 1338    Pricilla Loveless, MD 04/06/16 [id number]

## 2016-04-06 NOTE — Discharge Instructions (Signed)
Have the flu. Make sure you were staying hydrated plenty of water. You need to alternate Motrin and Tylenol for fever and pain. Have given you some cough medicine for your cough. This medication will make you drowsy so do not drive with it. Follow up with her primary care doctor as needed. Return to the ED develop worsening symptoms.

## 2016-04-08 LAB — CULTURE, GROUP A STREP (THRC)

## 2016-06-14 IMAGING — DX DG FINGER RING 2+V*L*
3 series · 3 of 3 positions shown · non-contrast
Comparison: 01/01/2013

CLINICAL DATA: Recent injury to the fourth digit with a drill,
initial encounter

EXAM:
LEFT RING FINGER 2+V

[finger ap]
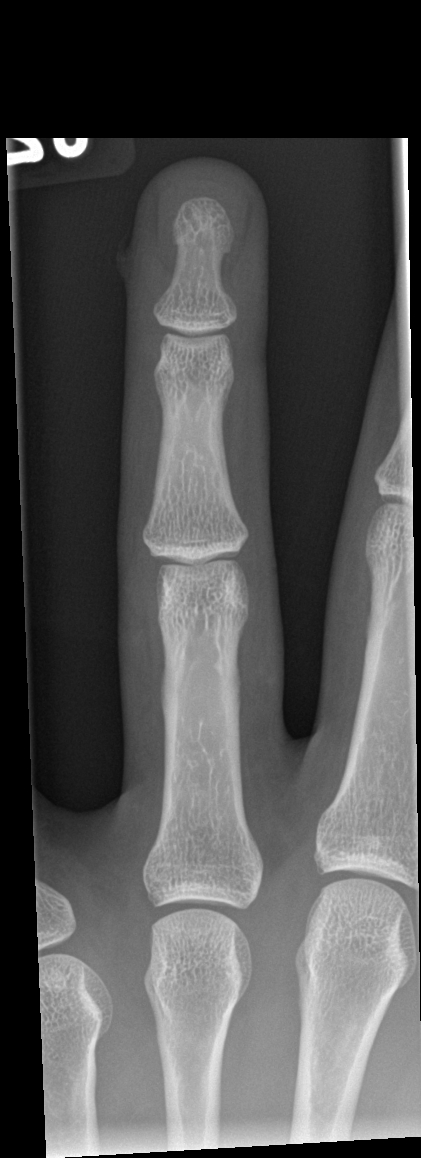

[finger obl]
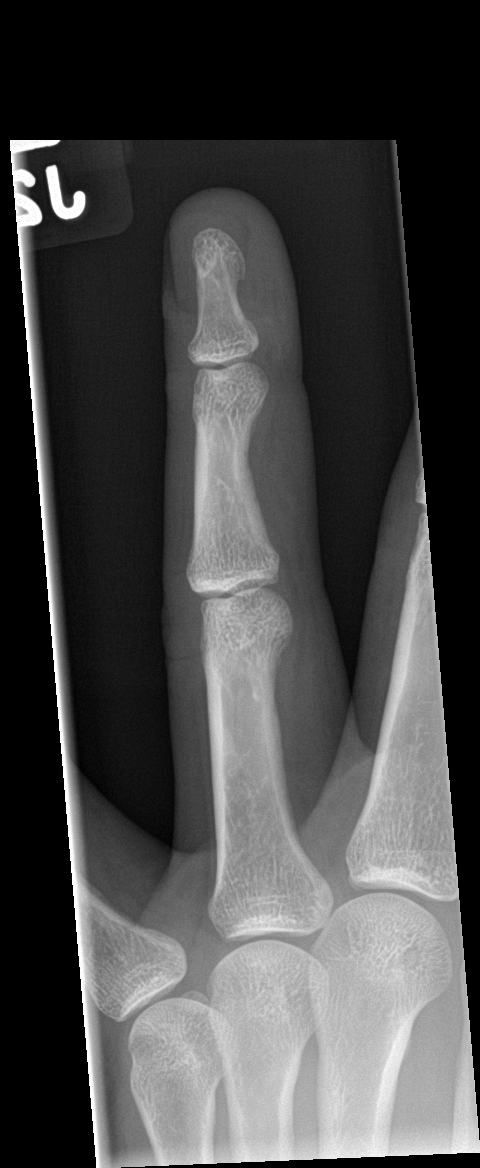

[finger lat]
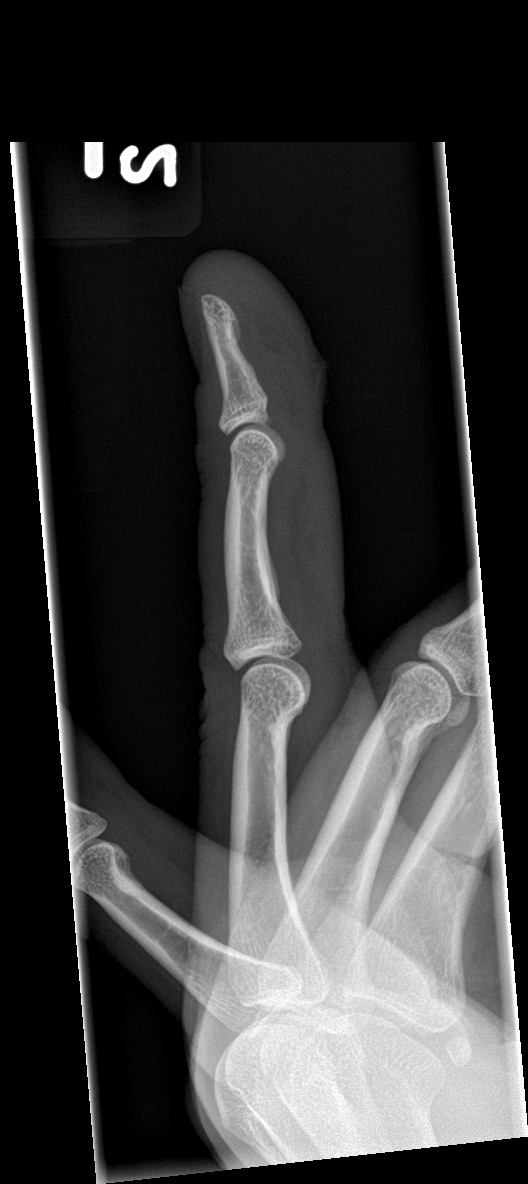

[3 of 3 positions shown; findings below may reference images not displayed]

FINDINGS: Soft tissue defect is noted along the distal phalanx of the fourth
digit. No underlying bony abnormality is noted. No radiopaque
foreign body is seen.
IMPRESSION: Soft tissue injury without acute bony abnormality.

## 2018-09-13 ENCOUNTER — Other Ambulatory Visit: Payer: Self-pay

## 2018-09-13 DIAGNOSIS — Z20822 Contact with and (suspected) exposure to covid-19: Secondary | ICD-10-CM

## 2018-09-17 LAB — NOVEL CORONAVIRUS, NAA: SARS-CoV-2, NAA: NOT DETECTED

## 2019-05-19 ENCOUNTER — Ambulatory Visit: Payer: Self-pay | Attending: Internal Medicine

## 2019-05-19 ENCOUNTER — Ambulatory Visit: Payer: Self-pay

## 2019-05-19 DIAGNOSIS — Z23 Encounter for immunization: Secondary | ICD-10-CM

## 2019-05-19 NOTE — Progress Notes (Signed)
   Covid-19 Vaccination Clinic  Name:  Craig Ionescu    MRN: [id number] DOB: October 15, 1985  05/19/2019  Mr. Wolfinger was observed post Covid-19 immunization for 15 minutes without incident. He was provided with Vaccine Information Sheet and instruction to access the V-Safe system.   Mr. Schuyler was instructed to call 911 with any severe reactions post vaccine: Marland Kitchen Difficulty breathing  . Swelling of face and throat  . A fast heartbeat  . A bad rash all over body  . Dizziness and weakness   Immunizations Administered    Name Date Dose VIS Date Route   Pfizer COVID-19 Vaccine 05/19/2019  1:35 PM 0.3 mL 01/31/2019 Intramuscular   Manufacturer: [employer], [employer]   Lot: [id number]   NDC: [address]-7

## 2019-05-22 ENCOUNTER — Ambulatory Visit: Payer: Self-pay

## 2019-06-10 ENCOUNTER — Ambulatory Visit: Payer: Self-pay | Attending: Internal Medicine

## 2019-06-10 DIAGNOSIS — Z23 Encounter for immunization: Secondary | ICD-10-CM

## 2019-06-10 NOTE — Progress Notes (Signed)
   Covid-19 Vaccination Clinic  Name:  Harold Peters    MRN: [id number] DOB: 1985/08/13  06/10/2019  [name] was observed post Covid-19 immunization for 15 minutes without incident. He was provided with Vaccine Information Sheet and instruction to access the V-Safe system.   [name] was instructed to call 911 with any severe reactions post vaccine: Marland Kitchen Difficulty breathing  . Swelling of face and throat  . A fast heartbeat  . A bad rash all over body  . Dizziness and weakness   Immunizations Administered    Name Date Dose VIS Date Route   Pfizer COVID-19 Vaccine 06/10/2019  2:18 PM 0.3 mL 04/16/2018 Intramuscular   Manufacturer: [employer], [employer]   Lot: [id number]   NDC: [address]-1

## 2019-12-01 ENCOUNTER — Other Ambulatory Visit: Payer: Self-pay

## 2020-02-27 ENCOUNTER — Other Ambulatory Visit: Payer: Self-pay

## 2022-08-13 ENCOUNTER — Ambulatory Visit (HOSPITAL_COMMUNITY)
Admission: EM | Admit: 2022-08-13 | Discharge: 2022-08-13 | Disposition: A | Discharge status: Home / Self Care | Payer: Self-pay | Attending: Emergency Medicine | Admitting: Emergency Medicine

## 2022-08-13 DIAGNOSIS — L237 Allergic contact dermatitis due to plants, except food: Secondary | ICD-10-CM

## 2022-08-13 MED ORDER — METHYLPREDNISOLONE ACETATE 80 MG/ML IJ SUSP
INTRAMUSCULAR | Status: AC
  Filled 2022-08-13: qty 1

## 2022-08-13 MED ORDER — METHYLPREDNISOLONE ACETATE 80 MG/ML IJ SUSP
60.0000 mg | Freq: Once | INTRAMUSCULAR | Status: AC
  Administered 2022-08-13: 60 mg via INTRAMUSCULAR

## 2022-08-13 MED ORDER — PREDNISONE 10 MG (21) PO TBPK: ORAL_TABLET | Freq: Every day | ORAL | 0 refills | Status: DC

## 2022-08-13 NOTE — ED Triage Notes (Signed)
Triaged by provider  

## 2022-08-13 NOTE — Discharge Instructions (Signed)
Today you are being treated for the poison ivy/oak rash  You have been given an injection of steroids today in the office today to help reduce the inflammatory process that occurs with this rash which will help minimize your itching as well as begin to clear  Starting tomorrow take prednisone every morning with food as directed, to continue the above process  You may continue use of topical calamine or Benadryl cream to help manage itching, you may also continue oral Benadryl  Please avoid long exposures to heat such as a hot steamy shower or being outside as this may cause further irritation to your rash  You may follow-up with his urgent care as needed if symptoms persist or worsen 

## 2022-08-13 NOTE — ED Provider Notes (Signed)
[facility]    CSN: [id number] Arrival date & time: 08/13/22  1233      History   Chief Complaint No chief complaint on file.   HPI Harold Peters is a 37 y.o. male.   Patient presents for evaluation of erythematous pruritic rash present to the bilateral upper extremities  for 5 days.  Has attempted use of oral and topical Benadryl as well as hydrocortisone which has helped with pruritus but rash has persisted.  Denies fever or drainage.  Was working outside at home prior to symptoms beginning, wife has similar rash.      No past medical history on file.  There are no problems to display for this patient.   Past Surgical History:  Procedure Laterality Date   APPENDECTOMY         Home Medications    Prior to Admission medications   Medication Sig Start Date End Date Taking? Authorizing Provider  acetaminophen (TYLENOL) 500 MG tablet Take 1,000 mg by mouth every 6 (six) hours as needed for moderate pain, fever or headache.    [provider]  acetaminophen (TYLENOL) 500 MG tablet Take 500 mg by mouth every 6 (six) hours as needed.    [provider]  cephALEXin (KEFLEX) 500 MG capsule Take 2 capsules (1,000 mg total) by mouth 2 (two) times daily. Patient not taking: Reported on 04/06/2016 12/30/14   Fayrene Helper, PA-C  cyclobenzaprine (FLEXERIL) 10 MG tablet Take 1 tablet (10 mg total) by mouth 3 (three) times daily as needed for muscle spasms. Patient not taking: Reported on 04/06/2016 06/27/13   Ambrose Finland, NP  guaiFENesin-codeine 100-10 MG/5ML syrup Take 5 mLs by mouth 3 (three) times daily as needed for cough. 04/06/16   Rise Mu, PA-C  HYDROcodone-acetaminophen (NORCO/VICODIN) 5-325 MG tablet Take 1 tablet by mouth every 6 (six) hours as needed for moderate pain. Patient not taking: Reported on 04/06/2016 12/30/14   Fayrene Helper, PA-C  ibuprofen (ADVIL,MOTRIN) 800 MG tablet Take 1 tablet (800 mg total) by mouth every 8  (eight) hours as needed. Patient not taking: Reported on 04/06/2016 06/27/13   Ambrose Finland, NP    Family History Family History  Problem Relation Age of Onset   Diabetes Mother    Diabetes Maternal Uncle     Social History Social History   Tobacco Use   Smoking status: Every Day    Packs/day: .2    Types: Cigarettes, E-cigarettes   Smokeless tobacco: Never  Substance Use Topics   Alcohol use: Yes    Alcohol/week: 3.0 standard drinks of alcohol    Types: 3 Cans of beer per week   Drug use: No     Allergies   Patient has no known allergies.   Review of Systems Review of Systems  Constitutional: Negative.   Respiratory: Negative.    Cardiovascular: Negative.   Gastrointestinal: Negative.   Skin:  Positive for rash. Negative for color change, pallor and wound.     Physical Exam Triage Vital Signs ED Triage Vitals  Enc Vitals Group     BP      Pulse      Resp      Temp      Temp src      SpO2      Weight      Height      Head Circumference      Peak Flow      Pain Score  Pain Loc      Pain Edu?      Excl. in GC?    No data found.  Updated Vital Signs There were no vitals taken for this visit.  Visual Acuity Right Eye Distance:   Left Eye Distance:   Bilateral Distance:    Right Eye Near:   Left Eye Near:    Bilateral Near:     Physical Exam Constitutional:      Appearance: Normal appearance.  Eyes:     Extraocular Movements: Extraocular movements intact.  Pulmonary:     Effort: Pulmonary effort is normal.  Musculoskeletal:     Comments: Erythematous papular clusters present to the bilateral upper extremities  Neurological:     Mental Status: He is alert and oriented to person, place, and time. Mental status is at baseline.      UC Treatments / Results  Labs (all labs ordered are listed, but only abnormal results are displayed) Labs Reviewed - No data to display  EKG   Radiology No results  found.  Procedures Procedures (including critical care time)  Medications Ordered in UC Medications - No data to display  Initial Impression / Assessment and Plan / UC Course  I have reviewed the triage vital signs and the nursing notes.  Pertinent labs & imaging results that were available during my care of the patient were reviewed by me and considered in my medical decision making (see chart for details).  Poison ivy dermatitis   Consistent with above diagnosis, appears inflammatory, no current signs of infection, methylprednisolone injection given in office and prednisone taper prescribed for outpatient use, may continue use of antihistamines for management of pruritus as needed, advised avoidance of long exposure to heat, may follow-up with his urgent care as needed  Final Clinical Impressions(s) / UC Diagnoses   Final diagnoses:  None   Discharge Instructions   None    ED Prescriptions   None    PDMP not reviewed this encounter.   Valinda Hoar, NP 08/13/22 1308

## 2022-10-30 ENCOUNTER — Telehealth: Payer: Self-pay

## 2022-10-30 ENCOUNTER — Encounter: Payer: Self-pay | Admitting: Nurse Practitioner

## 2022-10-30 NOTE — Telephone Encounter (Signed)
Patient has been having stomach pain for about 2 month. Patient has pain immediately after each meal. He did a [facility] test for H. Pylori and was positive. Only his family is establish. Would like to get treatment for infection. Spouse, Laretta Bolster, would like to know if she should also get tested for infection.

## 2022-10-30 NOTE — Telephone Encounter (Signed)
Patient on the wait list for new patient appointment.

## 2022-10-31 DIAGNOSIS — A048 Other specified bacterial intestinal infections: Secondary | ICD-10-CM

## 2022-10-31 HISTORY — DX: Other specified bacterial intestinal infections: A04.8

## 2022-10-31 NOTE — Telephone Encounter (Signed)
Patient has been scheduled

## 2022-11-02 ENCOUNTER — Ambulatory Visit: Payer: Self-pay | Admitting: Internal Medicine

## 2022-11-02 ENCOUNTER — Encounter: Payer: Self-pay | Admitting: Internal Medicine

## 2022-11-02 VITALS — BP 130/86 | HR 63 | Resp 16 | Ht 71.5 in | Wt 229.0 lb

## 2022-11-02 DIAGNOSIS — A048 Other specified bacterial intestinal infections: Secondary | ICD-10-CM

## 2022-11-02 MED ORDER — DOXYCYCLINE HYCLATE 100 MG PO TABS: ORAL_TABLET | ORAL | 0 refills | Status: DC

## 2022-11-02 MED ORDER — METRONIDAZOLE 250 MG PO TABS: ORAL_TABLET | ORAL | 0 refills | Status: DC

## 2022-11-02 MED ORDER — OMEPRAZOLE 20 MG PO CPDR: DELAYED_RELEASE_CAPSULE | ORAL | 1 refills | Status: DC

## 2022-11-02 MED ORDER — BISMUTH SUBSALICYLATE 262 MG PO CHEW: CHEWABLE_TABLET | ORAL | Status: DC

## 2022-11-02 NOTE — Progress Notes (Signed)
 Subjective:    Patient ID: Harold Peters, male   DOB: 26-Oct-1985, 37 y.o.   MRN: [id number]   HPI  Here to establish     Epigastric bloating and satiety after eating just a small amount.  Has been for about the last 2 months.  Went to [facility] in clinic on the 10th and sent for H. Pylori breath test which turned positive.   He has had acid up into his throat for 2 weeks, ending 1 week ago.  Generally worse after eating.   No melena or hematochezia.   No weight loss.   He has not taken any medication His mother died a couple weeks ago and he admits to stress.    Current Meds  Medication Sig   acetaminophen (TYLENOL) 500 MG tablet Take 500 mg by mouth every 6 (six) hours as needed.   No Known Allergies  Past Medical History:  Diagnosis Date   H. pylori infection 10/31/2022   Past Surgical History:  Procedure Laterality Date   APPENDECTOMY  1999   Family History  Problem Relation Age of Onset   Kidney disease Mother    Hypertension Mother    Diabetes Mother    Other Mother        GI blood loss, unable to find source   Environmental Allergies Daughter    Environmental Allergies Son    Diabetes Maternal Uncle    Social History   Socioeconomic History   Marital status: Married    Spouse name: Danirien   Number of children: 2   Years of education: 10   Highest education level: 10th grade  Occupational History   Occupation: Holiday representative  Tobacco Use   Smoking status: Every Day    Current packs/day: 0.50    Average packs/day: 0.5 packs/day for 15.1 years (7.6 ttl pk-yrs)    Types: Cigarettes    Start date: 2010   Smokeless tobacco: Never   Tobacco comments:    Has patches.  Vaping Use   Vaping status: Former  Substance and Sexual Activity   Alcohol use: Not Currently    Comment: none for 6 years--no overuse   Drug use: No    Comment: when young   Sexual activity: Not on file  Other Topics Concern   Not on file  Social History Narrative    Lives at home with wife, patient here, 2 children and mother in law.   Social Drivers of Corporate investment banker Strain: Not on file  Food Insecurity: Not on file  Transportation Needs: Not on file  Physical Activity: Not on file  Stress: Not on file  Social Connections: Not on file  Intimate Partner Violence: Not on file       Review of Systems    Objective:   BP 130/86 (BP Location: Left Arm, Patient Position: Sitting, Cuff Size: Normal)   Pulse 63   Resp 16   Ht 5' 11.5" (1.816 m)   Wt 229 lb (103.9 kg)   BMI 31.49 kg/m   Physical Exam HENT:     Right Ear: Tympanic membrane, ear canal and external ear normal.     Left Ear: Tympanic membrane, ear canal and external ear normal.     Mouth/Throat:     Mouth: Mucous membranes are moist.     Pharynx: Oropharynx is clear.  Eyes:     Extraocular Movements: Extraocular movements intact.     Conjunctiva/sclera: Conjunctivae normal.     Pupils:  Pupils are equal, round, and reactive to light.  Cardiovascular:     Rate and Rhythm: Normal rate and regular rhythm.     Pulses: Normal pulses.     Heart sounds: S1 normal and S2 normal. No murmur heard.    No friction rub. No S3 or S4 sounds.  Pulmonary:     Effort: Pulmonary effort is normal.     Breath sounds: Normal breath sounds and air entry.  Abdominal:     General: Bowel sounds are normal.     Palpations: Abdomen is soft. There is no hepatomegaly, splenomegaly or mass.     Tenderness: There is no abdominal tenderness.     Hernia: No hernia is present.  Musculoskeletal:     Cervical back: Normal range of motion and neck supple.     Right lower leg: No edema.     Left lower leg: No edema.  Neurological:     Mental Status: He is alert.      Assessment & Plan   Epigastric bloating and early satiety with recent + H pylori testing at walk in clinic:  2 week treatment with pepto bismal, Doxy, metronidazole and Omeprazole.  Then switch to once daily Omeprazole.     2.  Obesity:  fasting labs and CPE next available.

## 2023-01-01 ENCOUNTER — Encounter: Payer: Self-pay | Admitting: Nurse Practitioner

## 2023-01-01 ENCOUNTER — Ambulatory Visit (INDEPENDENT_AMBULATORY_CARE_PROVIDER_SITE_OTHER): Payer: Self-pay | Admitting: Nurse Practitioner

## 2023-01-01 VITALS — BP 112/68 | HR 63 | Ht 71.0 in | Wt 224.4 lb

## 2023-01-01 DIAGNOSIS — A048 Other specified bacterial intestinal infections: Secondary | ICD-10-CM

## 2023-01-01 NOTE — Patient Instructions (Addendum)
Your provider has ordered "[employer]" stool testing for you. You have received a kit from our office today containing all necessary supplies to complete this test. Please carefully read the stool collection instructions provided in the kit before opening the accompanying materials. In addition, be sure to place the label from the top right corner of the laboratory request sheet onto the "puritan opti-swab" tube that is supplied in the kit. This label should include your full name and date of birth. After completing the test, you should secure the purtian tube into the specimen biohazard bag. The laboratory request information sheet (including date and time of specimen collection) should be placed into the outside pocket of the specimen biohazard bag and returned to the [facility] with 2 days of collection.   If the laboratory information sheet specimen date and time are not filled out, the test will NOT be performed.  Contact our office if your stomach pain or fullness recurs.  Due to recent changes in healthcare laws, you may see the results of your imaging and laboratory studies on MyChart before your provider has had a chance to review them.  We understand that in some cases there may be results that are confusing or concerning to you. Not all laboratory results come back in the same time frame and the provider may be waiting for multiple results in order to interpret others.  Please give [place] 48 hours in order for your provider to thoroughly review all the results before contacting the office for clarification of your results.   Thank you for trusting me with your gastrointestinal care!   Alcide Evener, CRNP

## 2023-01-01 NOTE — Progress Notes (Addendum)
01/01/2023 Gregroy Peters [id number] [birth year]   CHIEF COMPLAINT: Stomach pain, felt full easily  HISTORY OF PRESENT ILLNESS: Harold Peters is a 37 year old male with prior history of binge alcohol consumption and recently diagnosed with H. Pylori infection. He developed stomach pain, described his stomach was on fire with increased burping. He sometimes felt like food would sit in his throat area but he was able to drink water and to continue eating. No regurgitation or vomiting. No upper or lower abdominal pain. He was seen by an [facility] provider and underwent H. pylori breath test which was positive 10/28/2022. He was prescribed Doxycycline 100 mg 1 tab p.o. twice daily, Metronidazole 250 mg 1 tab p.o. 4 times daily, bismuth subsalicylate 262 mg 2 tabs 4 times daily and Omeprazole 20mg  bid x 14 days then daily until his prescription ran out, he has 10 days left. He lives with his significant other, mother to his 2 children, and she had H. pylori infection in the past and she was recently retested and the result was negative. Both of their teenaged children were recently tested for H. pylori and were negative.  After completing the pylori treatment as noted above, his stomach burning and burping abated and his constipation resolved.  He endorses passing normal formed brown bowel movement daily.  No rectal bleeding or black stools.  Past Medical History:  Diagnosis Date   H. pylori infection 10/31/2022   Past Surgical History:  Procedure Laterality Date   APPENDECTOMY  1999   Social History: He is single.  He has 1 son and 1 daughter.  He is a self-employed in [employer].  He smokes cigarettes < 1ppd x 14 years.  He previously drank 30 beers 2 days weekly then stopped 5 years ago.  He infrequently drinks 1 alcoholic beverage every few months.  Last alcohol intake was 4 months ago.  No drug use.  Family History: Mother deceased with history of diabetes,  end-stage renal disease.  No known family history of esophageal, gastric or colon cancer.  No Known Allergies    Outpatient Encounter Medications as of 01/01/2023  Medication Sig   omeprazole (PRILOSEC) 20 MG capsule 1 cap by mouth twice daily on empty stomach for 14 days then 1 cap by mouth daily   acetaminophen (TYLENOL) 500 MG tablet Take 500 mg by mouth every 6 (six) hours as needed. (Patient not taking: Reported on 01/01/2023)   [DISCONTINUED] acetaminophen (TYLENOL) 500 MG tablet Take 1,000 mg by mouth every 6 (six) hours as needed for moderate pain, fever or headache.   [DISCONTINUED] bismuth subsalicylate (PEPTO BISMOL) 262 MG chewable tablet 2 tabs chewed 4 times daily for 14 days (Patient not taking: Reported on 01/01/2023)   [DISCONTINUED] doxycycline (VIBRA-TABS) 100 MG tablet 1 tab by mouth twice daily for 14 days.   [DISCONTINUED] metroNIDAZOLE (FLAGYL) 250 MG tablet 1 tab by mouth 4 times daily with food   No facility-administered encounter medications on file as of 01/01/2023.   REVIEW OF SYSTEMS:  Gen: Denies fever, sweats or chills. No weight loss.  CV: Denies chest pain, palpitations or edema. Resp: Denies cough, shortness of breath of hemoptysis.  GI: See HPI. GU: Denies urinary burning, blood in urine, increased urinary frequency or incontinence. MS: Denies joint pain, muscles aches or weakness. Derm: Denies rash, itchiness, skin lesions or unhealing ulcers. Psych: Denies depression, anxiety, memory loss or confusion. Heme: Denies bruising, easy bleeding. Neuro:  Denies headaches, dizziness or paresthesias.  Endo:  Denies any problems with DM, thyroid or adrenal function.  PHYSICAL EXAM: BP 112/68   Pulse 63   Ht 5\' 11"  (1.803 m)   Wt 224 lb 6 oz (101.8 kg)   BMI 31.29 kg/m  General: 37 year old male in no acute distress. Head: Normocephalic and atraumatic. Eyes:  Sclerae non-icteric, conjunctive pink. Ears: Normal auditory acuity. Mouth: Dentition intact.  No ulcers or lesions.  Neck: Supple, no lymphadenopathy or thyromegaly.  Lungs: Clear bilaterally to auscultation without wheezes, crackles or rhonchi. Heart: Regular rate and rhythm. No murmur, rub or gallop appreciated.  Abdomen: Soft, nontender, nondistended. No masses. No hepatosplenomegaly. Normoactive bowel sounds x 4 quadrants.  Rectal: Deferred  Musculoskeletal: Symmetrical with no gross deformities. Skin: Warm and dry. No rash or lesions on visible extremities. Extremities: No edema. Neurological: Alert oriented x 4, no focal deficits.  Psychological:  Alert and cooperative. Normal mood and affect.  ASSESSMENT AND PLAN:  37 year old male with burning stomach pain, increased belching and early satiety with a positive H. pylori breath test treated with Doxycycline, Metronidazole, Pepto-Bismol and PPI x 14 days and his symptoms abated.  He remains on Omeprazole 20 mg p.o. daily. -[employer] H. pylori stool antigen -Avoid spicy/fatty foods -Patient to contact our office if early satiety, stomach burning or increased belching symptoms recur -Patient provided with application for [place] financial assistance -Recommend EGD if [employer] H. pylori stool antigen remains positive or if symptoms recur -Check CBC and CMP once patient has [place] financial assistance  Addendum: Requested lab results from [facility] 11/07/2022 received as follows:  CBC: WBC 7.1.  Hemoglobin 17.4.  Hematocrit 52.3.  Platelet 214. CMP: Glucose 94.  BUN 12.  Creatinine 0.77.  Sodium 140.  Potassium 4.5.  Calcium 9.9.  Total bili 0.8.  Alk phos 64.  AST 25.  ALT 39. H. pylori stool antigen positive    CC:  No ref. provider found

## 2023-01-02 NOTE — Progress Notes (Signed)
I agree with assessment and plan outlined by [name].

## 2023-01-10 ENCOUNTER — Telehealth: Payer: Self-pay

## 2023-01-10 NOTE — Telephone Encounter (Signed)
Contacted patient and patient verbalized understanding of negative [employer] h pylori stool test.

## 2023-01-31 ENCOUNTER — Encounter: Payer: Self-pay | Admitting: Nurse Practitioner

## 2023-04-06 ENCOUNTER — Other Ambulatory Visit: Payer: Self-pay

## 2023-04-10 ENCOUNTER — Ambulatory Visit: Payer: Self-pay | Admitting: Internal Medicine

## 2023-04-10 ENCOUNTER — Encounter: Payer: Self-pay | Admitting: Internal Medicine

## 2023-04-10 VITALS — BP 116/70 | HR 67 | Resp 16 | Ht 72.0 in | Wt 225.0 lb

## 2023-04-10 DIAGNOSIS — R1030 Lower abdominal pain, unspecified: Secondary | ICD-10-CM

## 2023-04-10 DIAGNOSIS — E785 Hyperlipidemia, unspecified: Secondary | ICD-10-CM | POA: Insufficient documentation

## 2023-04-10 DIAGNOSIS — Z0000 Encounter for general adult medical examination without abnormal findings: Secondary | ICD-10-CM

## 2023-04-10 DIAGNOSIS — Z716 Tobacco abuse counseling: Secondary | ICD-10-CM

## 2023-04-10 DIAGNOSIS — Z720 Tobacco use: Secondary | ICD-10-CM | POA: Insufficient documentation

## 2023-04-10 NOTE — Progress Notes (Signed)
Subjective:    Patient ID: Harold Peters, male   DOB: 07/21/1985, 38 y.o.   MRN: [id number]   HPI  Here for Male CPE:  1.  STE:  Does perform.  No family history of testicular cancer.    2.  PSA: Never.  No family history of prostate cancer.    3.  Guaiac Cards/FIT:  Never.    4.  Colonoscopy:  Never.  No family history of colon cancer.   5.  Cholesterol/Glucose:  Dyslipidemia when was drinking alcohol too much in 2015.  Glucose and A1C always in normal range.   Lipid Panel     Component Value Date/Time   CHOL 149 07/03/2013 0935   TRIG 219 (H) 07/03/2013 0935   HDL 30 (L) 07/03/2013 0935   CHOLHDL 5.0 07/03/2013 0935   VLDL 44 (H) 07/03/2013 0935   LDLCALC 75 07/03/2013 0935     6.  Immunizations: Has not had COvID vaccination this year.   Immunization History  Administered Date(s) Administered   Influenza,inj,Quad PF,6+ Mos 12/25/2017   Influenza-Unspecified 12/07/2016, 11/24/2022   PFIZER(Purple Top)SARS-COV-2 Vaccination 05/19/2019, 06/10/2019   Tdap 08/15/2011, 12/30/2014     No outpatient medications have been marked as taking for the 04/10/23 encounter (Office Visit) with Julieanne Manson, MD.   No Known Allergies  Past Medical History:  Diagnosis Date   H. pylori infection 10/31/2022   Past Surgical History:  Procedure Laterality Date   APPENDECTOMY  1999   Family History  Problem Relation Age of Onset   Kidney disease Mother    Hypertension Mother    Diabetes Mother    Other Mother        GI blood loss, unable to find source   Environmental Allergies Daughter    Environmental Allergies Son    Diabetes Maternal Uncle    Social History   Socioeconomic History   Marital status: Married    Spouse name: Danirien   Number of children: 2   Years of education: 10   Highest education level: 10th grade  Occupational History   Occupation: Holiday representative  Tobacco Use   Smoking status: Every Day    Current packs/day: 0.50     Average packs/day: 0.5 packs/day for 15.1 years (7.6 ttl pk-yrs)    Types: Cigarettes    Start date: 2010   Smokeless tobacco: Never   Tobacco comments:    Has patches.  Vaping Use   Vaping status: Former  Substance and Sexual Activity   Alcohol use: Not Currently    Comment: overuse previously.  Stopped in 2017/   Drug use: Not Currently    Comment: when young.  Nothing IV   Sexual activity: Yes    Birth control/protection: None  Other Topics Concern   Not on file  Social History Narrative   Lives at home with wife, patient here, 2 children and mother in law.   Social Drivers of Corporate investment banker Strain: Low Risk  (04/10/2023)   Overall Financial Resource Strain (CARDIA)    Difficulty of Paying Living Expenses: Not hard at all  Food Insecurity: No Food Insecurity (04/10/2023)   Hunger Vital Sign    Worried About Running Out of Food in the Last Year: Never true    Ran Out of Food in the Last Year: Never true  Transportation Needs: No Transportation Needs (04/10/2023)   PRAPARE - Administrator, Civil Service (Medical): No    Lack of Transportation (Non-Medical): No  Physical Activity: Not on file  Stress: Not on file  Social Connections: Not on file  Intimate Partner Violence: Not At Risk (04/10/2023)   Humiliation, Afraid, Rape, and Kick questionnaire    Fear of Current or Ex-Partner: No    Emotionally Abused: No    Physically Abused: No    Sexually Abused: No      Review of Systems  HENT:  Negative for dental problem.   Respiratory:  Negative for shortness of breath.   Cardiovascular:  Positive for chest pain (generally at rest.  High sternal area--a little pressure.  Has this 4-5 times weekly.  Lasts 10-20 minutes.  Lies down and stretches out and feels better.).  Gastrointestinal:  Positive for abdominal pain (Lower abdominal pain just started yesterday.  Started drinking Red Bull once daily for past 2 weeks.  When drinks a lot of water,  discomfort resolves.  No change with passing flatus, BM or urination). Negative for blood in stool (No melena).      Objective:   BP 116/70 (BP Location: Right Arm, Patient Position: Sitting, Cuff Size: Normal)   Pulse 67   Resp 16   Ht 6' (1.829 m)   Wt 225 lb (102.1 kg)   BMI 30.52 kg/m   Physical Exam Constitutional:      Appearance: He is well-developed.  HENT:     Head: Normocephalic and atraumatic.     Right Ear: Tympanic membrane, ear canal and external ear normal.     Left Ear: Tympanic membrane, ear canal and external ear normal.     Nose: Nose normal.     Mouth/Throat:     Mouth: Mucous membranes are moist.     Pharynx: Oropharynx is clear.  Eyes:     Extraocular Movements: Extraocular movements intact.     Conjunctiva/sclera: Conjunctivae normal.     Pupils: Pupils are equal, round, and reactive to light.  Neck:     Thyroid: No thyroid mass or thyromegaly.  Cardiovascular:     Rate and Rhythm: Normal rate and regular rhythm.     Heart sounds: S1 normal and S2 normal. No murmur heard.    No friction rub. No S3 or S4 sounds.     Comments: No carotid bruits.  Carotid, radial, femoral, DP and PT pulses normal and equal.   Pulmonary:     Effort: Pulmonary effort is normal.     Breath sounds: Normal breath sounds and air entry.  Abdominal:     General: Abdomen is flat. Bowel sounds are normal.     Palpations: Abdomen is soft. There is no hepatomegaly, splenomegaly or mass.     Tenderness: There is no abdominal tenderness.     Hernia: No hernia is present.  Genitourinary:    Penis: Normal and uncircumcised.      Testes:        Right: Mass or tenderness not present. Right testis is descended.        Left: Mass or tenderness not present. Left testis is descended.  Musculoskeletal:        General: Normal range of motion.     Cervical back: Normal range of motion and neck supple.     Right lower leg: No edema.     Left lower leg: No edema.  Lymphadenopathy:      Head:     Right side of head: No submental or submandibular adenopathy.     Left side of head: No submental or submandibular adenopathy.  Cervical: No cervical adenopathy.     Upper Body:     Right upper body: No supraclavicular adenopathy.     Left upper body: No supraclavicular adenopathy.     Lower Body: No right inguinal adenopathy. No left inguinal adenopathy.  Skin:    General: Skin is warm.     Capillary Refill: Capillary refill takes less than 2 seconds.     Findings: No lesion or rash.  Neurological:     General: No focal deficit present.     Mental Status: He is alert and oriented to person, place, and time.     Cranial Nerves: Cranial nerves 2-12 are intact.     Sensory: Sensation is intact.     Motor: Motor function is intact.     Coordination: Coordination is intact.     Gait: Gait is intact.     Deep Tendon Reflexes: Reflexes are normal and symmetric.  Psychiatric:        Mood and Affect: Mood normal.        Speech: Speech normal.        Behavior: Behavior normal. Behavior is cooperative.      Assessment & Plan   CPE CBC, CMP, FLP Encouraged going to [facility] COVID vaccine clinic  2.  Lower abdominal pain:  no findings on exam.  Stop Red Bulls.  3.  Chest discomfort:  Musculoskeletal.  To call if progresses  4.  Dyslipidemia:  FLP  5.  Tobacco abuse:  encouraged using nicotine lozenges or gum--see patient handout

## 2023-04-10 NOTE — Patient Instructions (Addendum)
Tobacco Cessation:   [id number] or [phone], the former for support and possibly free nicotine patches/gum and support; the latter for [facility] Smoking cessation class. Get rid of all smoking supplies:  Cigarettes, lighters, ashtrays--no stashes just in case at home if you are serious.  Get nicotine lozenges or gum--do not crunch the lozenge.  Chew gum until juicy and then stash between lip and gum until done with nicotine.

## 2023-04-11 LAB — COMPREHENSIVE METABOLIC PANEL
ALT: 46 [IU]/L — ABNORMAL HIGH (ref 0–44)
AST: 31 [IU]/L (ref 0–40)
Albumin: 4.8 g/dL (ref 4.1–5.1)
Alkaline Phosphatase: 60 [IU]/L (ref 44–121)
BUN/Creatinine Ratio: 13 (ref 9–20)
BUN: 10 mg/dL (ref 6–20)
Bilirubin Total: 1 mg/dL (ref 0.0–1.2)
CO2: 23 mmol/L (ref 20–29)
Calcium: 9.5 mg/dL (ref 8.7–10.2)
Chloride: 105 mmol/L (ref 96–106)
Creatinine, Ser: 0.79 mg/dL (ref 0.76–1.27)
Globulin, Total: 2.5 g/dL (ref 1.5–4.5)
Glucose: 89 mg/dL (ref 70–99)
Potassium: 4.3 mmol/L (ref 3.5–5.2)
Sodium: 143 mmol/L (ref 134–144)
Total Protein: 7.3 g/dL (ref 6.0–8.5)
eGFR: 117 mL/min/{1.73_m2} (ref >59)

## 2023-04-11 LAB — LIPID PANEL W/O CHOL/HDL RATIO
Cholesterol, Total: 150 mg/dL (ref 100–199)
HDL: 32 mg/dL — ABNORMAL LOW (ref >39)
LDL Chol Calc (NIH): 51 mg/dL (ref 0–99)
Triglycerides: 452 mg/dL — ABNORMAL HIGH (ref 0–149)
VLDL Cholesterol Cal: 67 mg/dL — ABNORMAL HIGH (ref 5–40)

## 2023-04-11 LAB — CBC WITH DIFFERENTIAL/PLATELET
Basophils Absolute: 0 10*3/uL (ref 0.0–0.2)
Basos: 0 %
EOS (ABSOLUTE): 0.1 10*3/uL (ref 0.0–0.4)
Eos: 1 %
Hematocrit: 48.5 % (ref 37.5–51.0)
Hemoglobin: 16.6 g/dL (ref 13.0–17.7)
Immature Grans (Abs): 0 10*3/uL (ref 0.0–0.1)
Immature Granulocytes: 0 %
Lymphocytes Absolute: 2.2 10*3/uL (ref 0.7–3.1)
Lymphs: 36 %
MCH: 30.4 pg (ref 26.6–33.0)
MCHC: 34.2 g/dL (ref 31.5–35.7)
MCV: 89 fL (ref 79–97)
Monocytes Absolute: 0.3 10*3/uL (ref 0.1–0.9)
Monocytes: 5 %
Neutrophils Absolute: 3.5 10*3/uL (ref 1.4–7.0)
Neutrophils: 58 %
Platelets: 188 10*3/uL (ref 150–450)
RBC: 5.46 x10E6/uL (ref 4.14–5.80)
RDW: 12.8 % (ref 11.6–15.4)
WBC: 6 10*3/uL (ref 3.4–10.8)

## 2023-05-18 ENCOUNTER — Encounter: Payer: Self-pay | Admitting: Internal Medicine

## 2023-08-31 ENCOUNTER — Other Ambulatory Visit: Payer: Self-pay | Admitting: Internal Medicine

## 2023-08-31 DIAGNOSIS — E785 Hyperlipidemia, unspecified: Secondary | ICD-10-CM

## 2023-08-31 NOTE — Addendum Note (Signed)
 Addended by: ADELLA ALMARIE HERO on: 08/31/2023 10:01 AM   Modules accepted: Orders

## 2023-09-01 LAB — LIPID PANEL W/O CHOL/HDL RATIO
Cholesterol, Total: 137 mg/dL (ref 100–199)
HDL: 26 mg/dL — ABNORMAL LOW (ref >39)
LDL Chol Calc (NIH): 41 mg/dL (ref 0–99)
Triglycerides: 490 mg/dL — ABNORMAL HIGH (ref 0–149)
VLDL Cholesterol Cal: 70 mg/dL — ABNORMAL HIGH (ref 5–40)

## 2023-11-12 ENCOUNTER — Ambulatory Visit: Payer: Self-pay | Admitting: Internal Medicine

## 2023-11-12 DIAGNOSIS — E785 Hyperlipidemia, unspecified: Secondary | ICD-10-CM

## 2023-11-12 MED ORDER — FENOFIBRATE 160 MG PO TABS: 160.0000 mg | ORAL_TABLET | Freq: Every day | ORAL | 11 refills | Status: AC

## 2024-01-07 ENCOUNTER — Encounter: Payer: Self-pay | Admitting: Internal Medicine

## 2024-01-14 ENCOUNTER — Other Ambulatory Visit: Payer: Self-pay

## 2024-01-25 ENCOUNTER — Other Ambulatory Visit: Payer: Self-pay

## 2024-04-11 ENCOUNTER — Other Ambulatory Visit: Payer: Self-pay

## 2024-04-15 ENCOUNTER — Encounter: Payer: Self-pay | Admitting: Internal Medicine
